# Patient Record
Sex: Female | Born: 1945 | Race: White | Hispanic: No | Marital: Single
Health system: Southern US, Community
[De-identification: ages and names within clinical notes are randomized; demographics above are authoritative.]

---

## 2004-12-28 ENCOUNTER — Ambulatory Visit: Payer: Self-pay | Admitting: Family Medicine

## 2006-06-08 ENCOUNTER — Other Ambulatory Visit: Payer: Self-pay

## 2006-06-08 ENCOUNTER — Emergency Department: Payer: Self-pay | Admitting: Emergency Medicine

## 2009-06-23 ENCOUNTER — Ambulatory Visit: Payer: Self-pay | Admitting: Vascular Surgery

## 2012-01-13 ENCOUNTER — Emergency Department: Payer: Self-pay | Admitting: Internal Medicine

## 2012-01-13 LAB — CBC
HCT: 38.6 % (ref 35.0–47.0)
HGB: 12.6 g/dL (ref 12.0–16.0)
MCH: 28.4 pg (ref 26.0–34.0)
MCHC: 32.7 g/dL (ref 32.0–36.0)
RBC: 4.44 10*6/uL (ref 3.80–5.20)
RDW: 13.4 % (ref 11.5–14.5)

## 2012-01-13 LAB — URINALYSIS, COMPLETE
Bilirubin,UR: NEGATIVE
Nitrite: NEGATIVE
Ph: 5 (ref 4.5–8.0)
Protein: 100
RBC,UR: 3 /HPF (ref 0–5)
Specific Gravity: 1.015 (ref 1.003–1.030)
WBC UR: 1 /HPF (ref 0–5)

## 2012-01-13 LAB — COMPREHENSIVE METABOLIC PANEL
Alkaline Phosphatase: 108 U/L (ref 50–136)
Bilirubin,Total: 0.5 mg/dL (ref 0.2–1.0)
Calcium, Total: 9.1 mg/dL (ref 8.5–10.1)
Co2: 23 mmol/L (ref 21–32)
Creatinine: 1.12 mg/dL (ref 0.60–1.30)
Potassium: 4.5 mmol/L (ref 3.5–5.1)
SGOT(AST): 22 U/L (ref 15–37)
SGPT (ALT): 16 U/L (ref 12–78)
Total Protein: 8 g/dL (ref 6.4–8.2)

## 2012-01-13 LAB — LIPASE, BLOOD: Lipase: 113 U/L (ref 73–393)

## 2012-04-25 ENCOUNTER — Emergency Department: Payer: Self-pay | Admitting: Emergency Medicine

## 2012-04-25 LAB — CBC
Platelet: 279 10*3/uL (ref 150–440)
RBC: 4.7 10*6/uL (ref 3.80–5.20)
RDW: 13.5 % (ref 11.5–14.5)
WBC: 11.9 10*3/uL — ABNORMAL HIGH (ref 3.6–11.0)

## 2012-04-25 LAB — COMPREHENSIVE METABOLIC PANEL
Alkaline Phosphatase: 113 U/L (ref 50–136)
Anion Gap: 12 (ref 7–16)
Calcium, Total: 9.5 mg/dL (ref 8.5–10.1)
Chloride: 101 mmol/L (ref 98–107)
Co2: 21 mmol/L (ref 21–32)
Creatinine: 1.18 mg/dL (ref 0.60–1.30)
EGFR (African American): 56 — ABNORMAL LOW
EGFR (Non-African Amer.): 48 — ABNORMAL LOW
Potassium: 4.5 mmol/L (ref 3.5–5.1)
SGOT(AST): 21 U/L (ref 15–37)
Sodium: 134 mmol/L — ABNORMAL LOW (ref 136–145)
Total Protein: 8.2 g/dL (ref 6.4–8.2)

## 2012-04-25 LAB — URINALYSIS, COMPLETE
Leukocyte Esterase: NEGATIVE
Nitrite: NEGATIVE
Ph: 6 (ref 4.5–8.0)
Protein: 100
Specific Gravity: 1.013 (ref 1.003–1.030)

## 2012-04-25 LAB — LIPASE, BLOOD: Lipase: 78 U/L (ref 73–393)

## 2012-04-27 ENCOUNTER — Inpatient Hospital Stay: Payer: Self-pay | Admitting: Internal Medicine

## 2012-04-27 LAB — COMPREHENSIVE METABOLIC PANEL
Albumin: 3.8 g/dL (ref 3.4–5.0)
Alkaline Phosphatase: 107 U/L (ref 50–136)
BUN: 39 mg/dL — ABNORMAL HIGH (ref 7–18)
Calcium, Total: 8.9 mg/dL (ref 8.5–10.1)
Glucose: 308 mg/dL — ABNORMAL HIGH (ref 65–99)
Osmolality: 284 (ref 275–301)
Potassium: 3.8 mmol/L (ref 3.5–5.1)
SGOT(AST): 27 U/L (ref 15–37)
SGPT (ALT): 25 U/L (ref 12–78)
Sodium: 131 mmol/L — ABNORMAL LOW (ref 136–145)

## 2012-04-27 LAB — URINALYSIS, COMPLETE
Glucose,UR: >=500 mg/dL (ref 0–75)
Nitrite: NEGATIVE
Ph: 5 (ref 4.5–8.0)
Protein: 100
RBC,UR: 5 /HPF (ref 0–5)

## 2012-04-27 LAB — CBC WITH DIFFERENTIAL/PLATELET
Basophil #: 0.2 10*3/uL — ABNORMAL HIGH (ref 0.0–0.1)
Eosinophil #: 0 10*3/uL (ref 0.0–0.7)
HCT: 42.1 % (ref 35.0–47.0)
Lymphocyte %: 8.6 %
MCHC: 33.6 g/dL (ref 32.0–36.0)
MCV: 87 fL (ref 80–100)
Monocyte %: 5 %
Neutrophil %: 85.6 %
RBC: 4.85 10*6/uL (ref 3.80–5.20)
RDW: 13.6 % (ref 11.5–14.5)
WBC: 24.1 10*3/uL — ABNORMAL HIGH (ref 3.6–11.0)

## 2012-04-27 LAB — LIPASE, BLOOD: Lipase: 80 U/L (ref 73–393)

## 2012-04-27 LAB — PREGNANCY, URINE: Pregnancy Test, Urine: NEGATIVE m[IU]/mL

## 2012-04-28 LAB — CBC WITH DIFFERENTIAL/PLATELET
Basophil #: 0.1 10*3/uL (ref 0.0–0.1)
Basophil %: 0.3 %
Eosinophil #: 0 10*3/uL (ref 0.0–0.7)
Lymphocyte %: 10.4 %
MCH: 30 pg (ref 26.0–34.0)
MCHC: 34.7 g/dL (ref 32.0–36.0)
Monocyte #: 1.4 x10 3/mm — ABNORMAL HIGH (ref 0.2–0.9)
Neutrophil #: 18.2 10*3/uL — ABNORMAL HIGH (ref 1.4–6.5)
Neutrophil %: 82.8 %
Platelet: 234 10*3/uL (ref 150–440)
RDW: 13.3 % (ref 11.5–14.5)

## 2012-04-28 LAB — BASIC METABOLIC PANEL
BUN: 33 mg/dL — ABNORMAL HIGH (ref 7–18)
Calcium, Total: 8.3 mg/dL — ABNORMAL LOW (ref 8.5–10.1)
Chloride: 99 mmol/L (ref 98–107)
Co2: 27 mmol/L (ref 21–32)
EGFR (African American): 49 — ABNORMAL LOW
EGFR (Non-African Amer.): 42 — ABNORMAL LOW
Osmolality: 285 (ref 275–301)
Potassium: 3.5 mmol/L (ref 3.5–5.1)

## 2012-04-29 LAB — TROPONIN I
Troponin-I: 0.06 ng/mL — ABNORMAL HIGH
Troponin-I: 0.06 ng/mL — ABNORMAL HIGH

## 2012-04-30 LAB — CBC WITH DIFFERENTIAL/PLATELET
Basophil #: 0.1 10*3/uL (ref 0.0–0.1)
Eosinophil #: 0.2 10*3/uL (ref 0.0–0.7)
Eosinophil %: 1.6 %
HCT: 36.9 % (ref 35.0–47.0)
Lymphocyte #: 3.2 10*3/uL (ref 1.0–3.6)
MCH: 29.6 pg (ref 26.0–34.0)
MCV: 86 fL (ref 80–100)
Monocyte #: 0.8 x10 3/mm (ref 0.2–0.9)
Neutrophil #: 9.3 10*3/uL — ABNORMAL HIGH (ref 1.4–6.5)
Neutrophil %: 67.8 %
Platelet: 202 10*3/uL (ref 150–440)
RDW: 13.4 % (ref 11.5–14.5)

## 2012-04-30 LAB — BASIC METABOLIC PANEL
BUN: 23 mg/dL — ABNORMAL HIGH (ref 7–18)
Chloride: 102 mmol/L (ref 98–107)
Creatinine: 1.34 mg/dL — ABNORMAL HIGH (ref 0.60–1.30)
EGFR (Non-African Amer.): 41 — ABNORMAL LOW
Osmolality: 283 (ref 275–301)

## 2012-05-29 DEATH — deceased

## 2013-09-03 IMAGING — CT CT ABD-PELV W/O CM
1 of 2 series · 15 of 32 positions shown, 19 images · non-contrast
Comparison: none

REASON FOR EXAM: (1) n/v/d with epigastric abd pain, h/o diverticulitis.;
(2) n/v/d with epigastr
COMMENTS:

PROCEDURE:     CT  - CT ABDOMEN AND PELVIS W[DATE]  [DATE]
RESULT:     CT abdomen and pelvis dated 04/25/2012.
TECHNIQUE: Helical noncontrasted 3 mm sections were obtained from the lung
bases through the pubic symphysis. Findings: The lung bases are unremarkable.

[Series 2: 3mm soft tissue · axial · 0.98mm/px · z∈[-518,-96]mm · 15 of 155 slices shown, 19 images]
[im 7/155  soft-tissue]
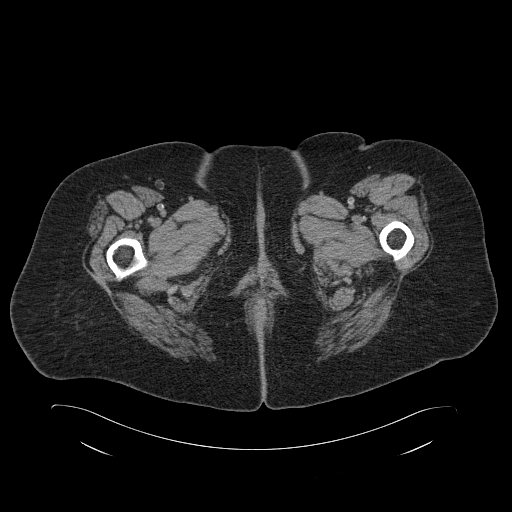
[im 7/155  bone]
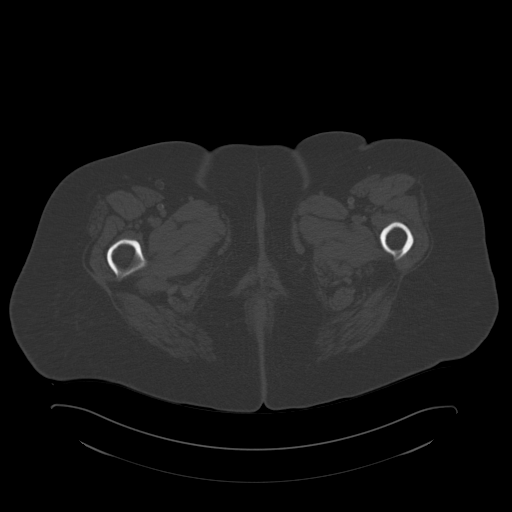
[im 20/155  soft-tissue]
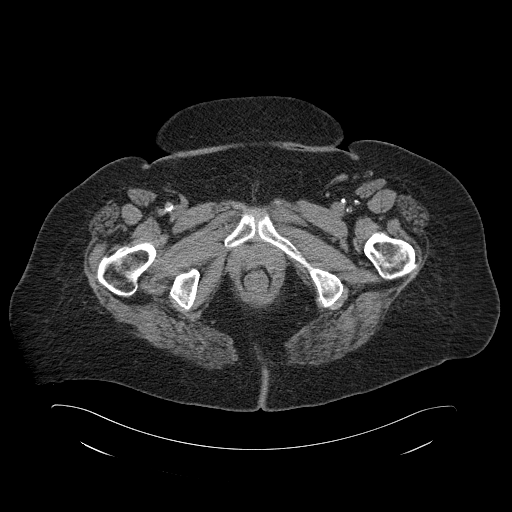
[im 33/155  soft-tissue]
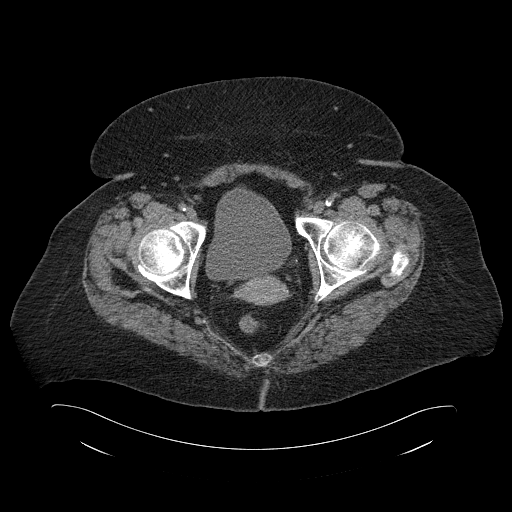
[im 45/155  soft-tissue]
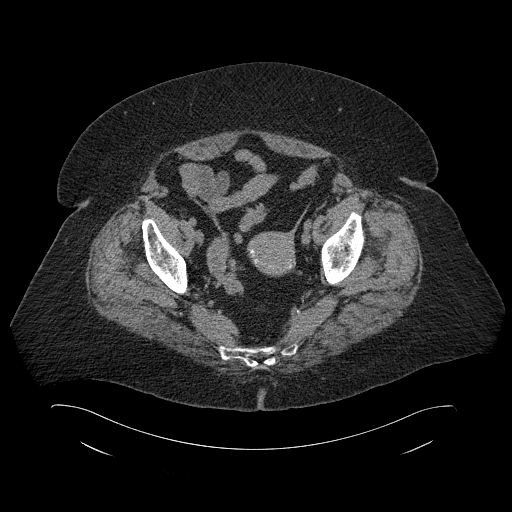
[im 52/155  soft-tissue]
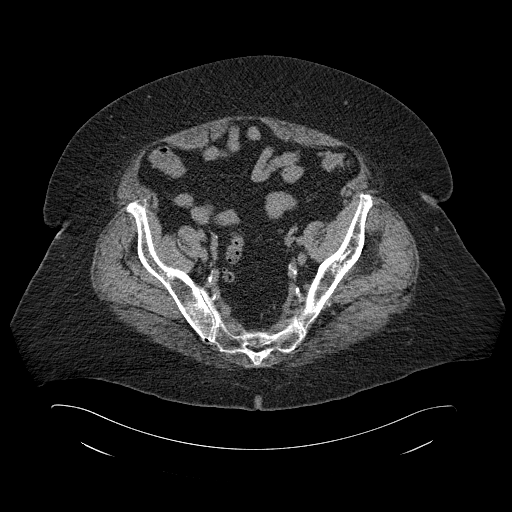
[im 65/155  soft-tissue]
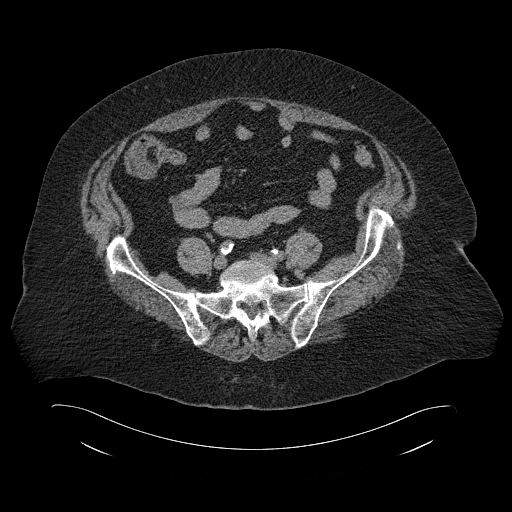
[im 78/155  soft-tissue]
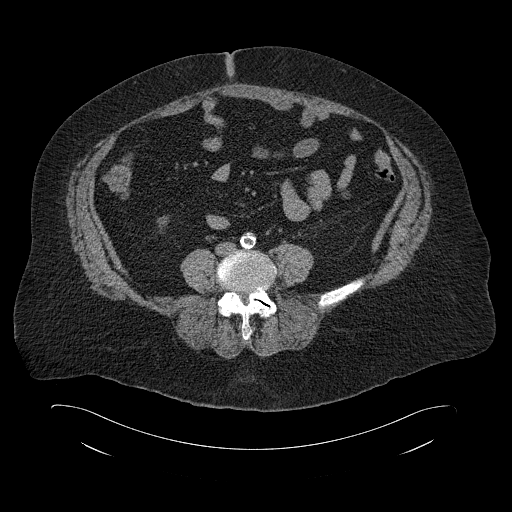
[im 90/155  soft-tissue]
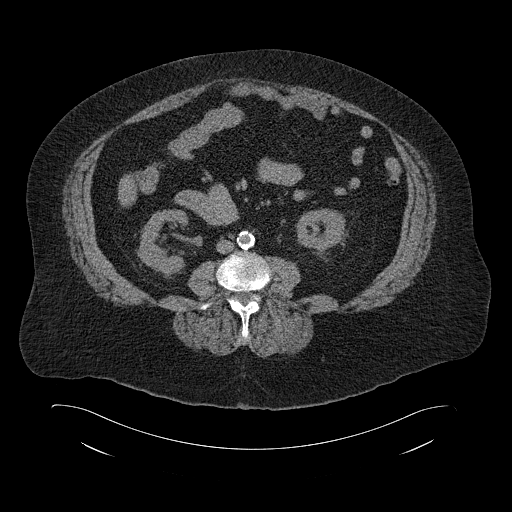
[im 103/155  soft-tissue]
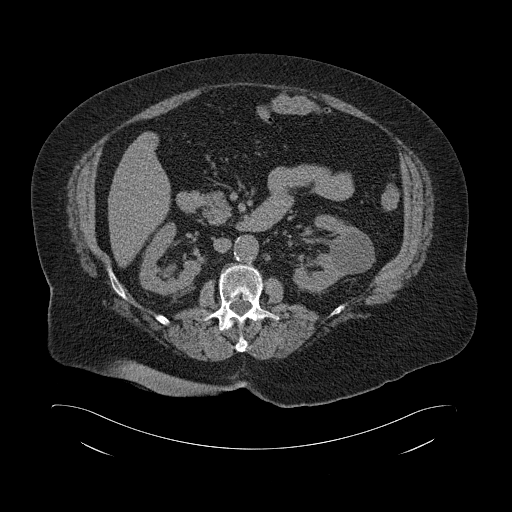
[im 103/155  bone]
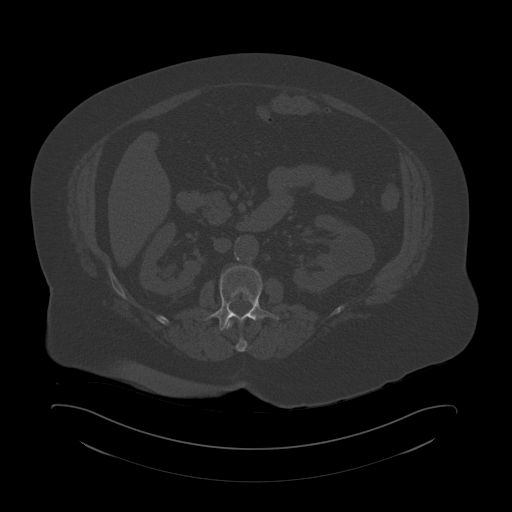
[im 110/155  soft-tissue]
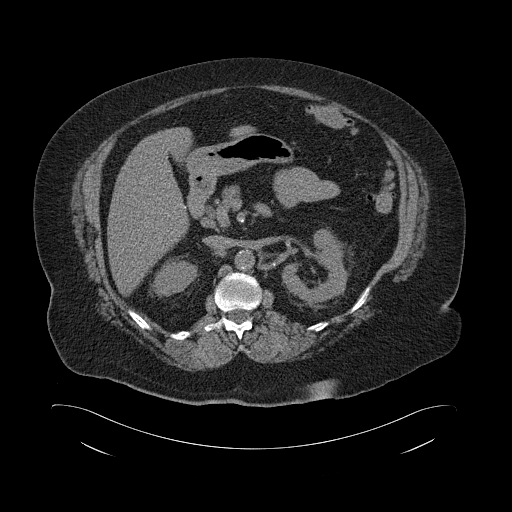
[im 122/155  soft-tissue]
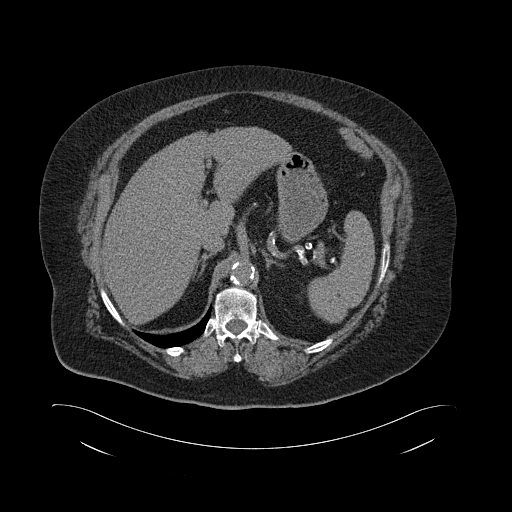
[im 129/155  lung]
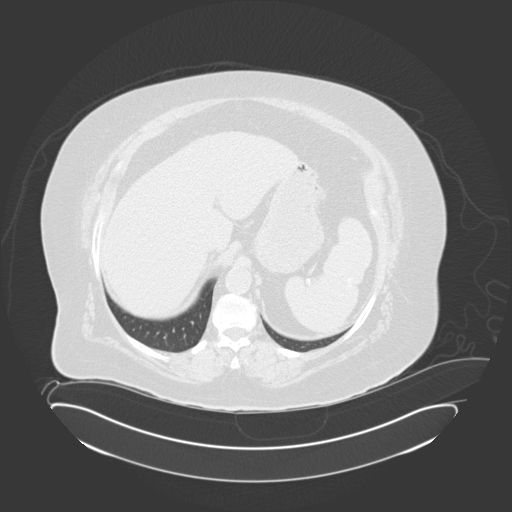
[im 135/155  soft-tissue]
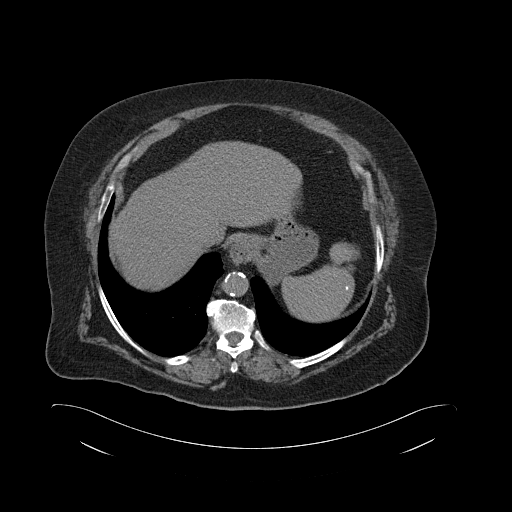
[im 135/155  lung]
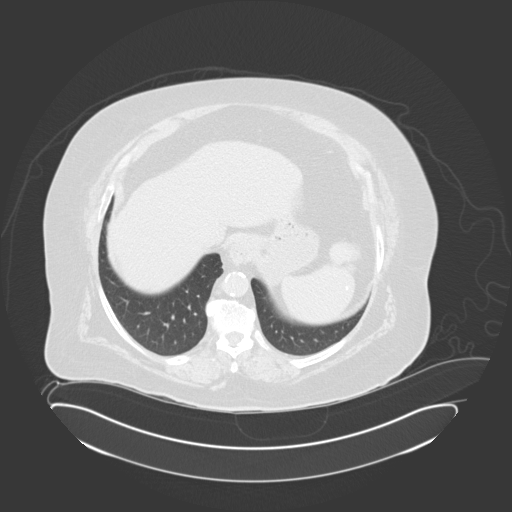
[im 142/155  lung]
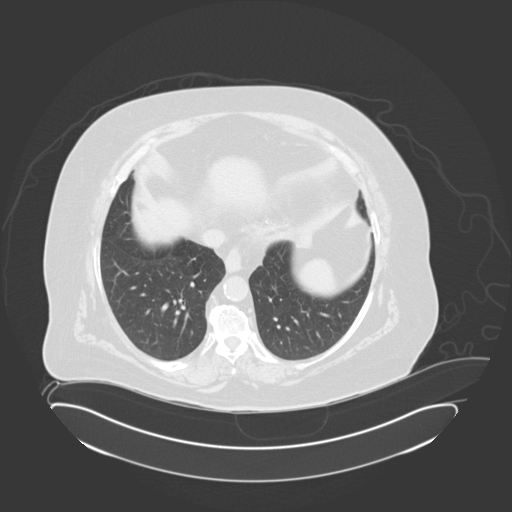
[im 148/155  soft-tissue]
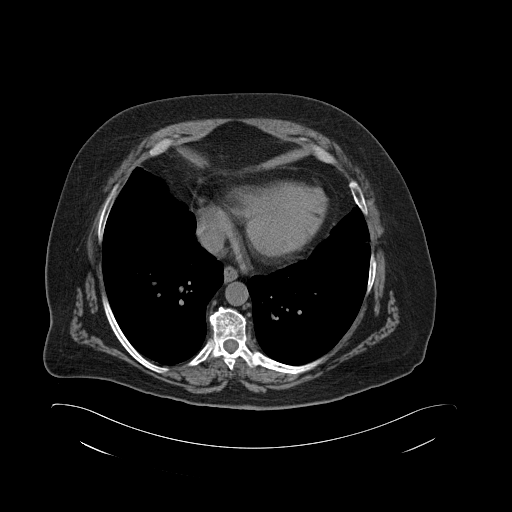
[im 148/155  lung]
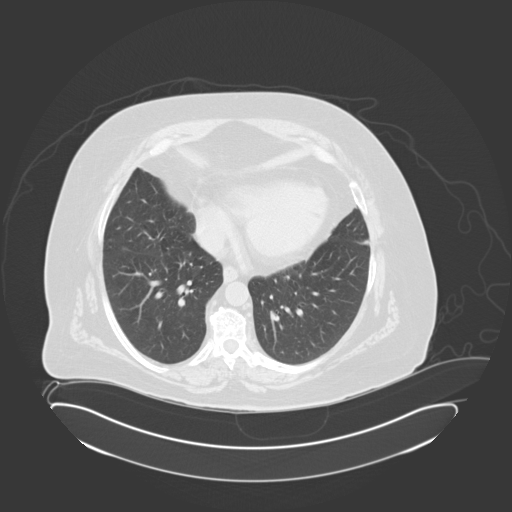

[15 of 32 positions shown; findings below may reference images not displayed]

Noncontrast evaluation of the liver, adrenals, pancreas its unremarkable.
Dystrophic appearing calcifications are appreciated within the spleen which
may represent prior granulomatous, infection, or trauma.

There is no evidence of abdominal or pelvic free fluid, loculated fluid
collections, masses nor adenopathy. There is no CT evidence of
nephrolithiasis, ureterolithiasis, hydroureter nor hydronephrosis. A stable
cyst is appreciated involving the left kidney. Within the pelvis a [DATE] to 3
cm soft tissue focus of dystrophic calcifications projects along the
anterior lateral fundal portion of the uterus likely represents a dystrophic
exophytic fibroid. Pelvis is otherwise unremarkable.

There is diverticulosis within the sigmoid colon without evidence of
diverticulitis. There is no evidence of abdominal aortic aneurysm.
IMPRESSION: No CT evidence of obstructive or inflammatory abnormalities.

## 2014-09-15 NOTE — Discharge Summary (Signed)
PATIENT NAME:  Carly Travis, Carly Travis MR#:  [id number] DATE OF BIRTH:  Dec 07, 1945  DATE OF ADMISSION:  04/27/2012 DATE OF DISCHARGE:  04/30/2012  DIAGNOSES:  1. Abdominal pain, nausea, vomiting, diarrhea possibly due to viral gastroenteritis, possible gastroesophageal reflux disease/gastroparesis. 2. Leukocytosis. 3. Acute renal failure. 4. Diabetes. 5. Hypertension. 6. Hyperlipidemia. 7. Depression. 8. Smoking. 9. Chest pain with inpatient stress test showing no evidence of any ischemia.   CONSULTATION: Cardiology consultation with Dr. Darrold JunkerParaschos   LABORATORY, DIAGNOSTIC, AND RADIOLOGICAL DATA: Inpatient Nuclear Medicine stress test showed no evidence of any reversible ischemia. The patient had EF of about 45% with anterior hypokinesis. The patient does have a history of prior MI.   CT of the abdomen which was done on November 20th was essentially normal.   Stool culture was negative. C. difficile was negative. White count 24.1 at the time of admission, 13.7 by the time of discharge. Hemoglobin and platelet count normal. Creatinine 1.50 to 1.34, potassium 3.8, was supplemented. Magnesium supplemented. Troponin 0.11 to 0.06. Glucose 170, BUN 23.   HOSPITAL COURSE: The patient is a 69 year old female with past medical history of diabetes, hypertension, smoking, PAD status post lower extremity stents who presented with nausea, vomiting, diarrhea, and leukocytosis. She had come to the ER a couple of days earlier complaining of similar symptoms. At that time a CT of the abdomen showed no acute abnormalities. She was discharged home. She had to be admitted to the hospital because her white count had gone up and she had ongoing nausea, vomiting, and diarrhea. She was admitted to the hospital and started on IV fluids, p.r.n. antiemetics. She was on a clear liquid diet and was subsequently advanced to a regular diet. After admission the patient had no diarrhea. She had two to three stools. C. difficile and stool  cultures were sent which were normal. She was empirically treated with antibiotics, however, once the stool cultures came back negative and she had no further diarrhea in the hospital her antibiotics were stopped. She had no vomiting but complained of some nausea. She also had retrosternal burning pain and there was a possibility that she may have underlying gastroesophageal reflux disease or gastroparesis related to her diabetes. She has been started on PPI. Her leukocytosis has significantly improved. She had acute renal failure due to nausea, vomiting, and diarrhea which is improved. Her diabetes has remained well controlled. Due to her acute renal failure, her diuretic therapy for hypertension was stopped and she was started on a beta-blocker. She has been extensively counseled about smoking cessation. During the hospitalization she also complained of some retrosternal chest pain. Cardiac enzymes were checked and were very minimally elevated. Cardiology consultation with Dr. Darrold JunkerParaschos was obtained who recommended doing an inpatient stress test. The stress test showed no evidence of any acute ischemia. The patient had a fixed anterior defect with EF of 45%. She does have a history of MI in the past. She had low potassium and magnesium which were supplemented. The patient was extremely anxious and had to be started on as needed Xanax. She is being discharged home in a stable condition. If she continues to have nausea, she may require an outpatient GI evaluation.   TIME SPENT: 45 minutes.   ____________________________ Darrick MeigsSangeeta Annette Liotta, MD sp:drc D: 05/01/2012 14:11:00 ET T: 05/01/2012 14:19:49 ET JOB#: [id number]  cc: Darrick MeigsSangeeta Josaiah Muhammed, MD, <Dictator> Darrick MeigsSANGEETA Kaylyne Axton MD ELECTRONICALLY SIGNED 05/01/2012 14:53

## 2014-09-15 NOTE — Consult Note (Signed)
Brief Consult Note: Diagnosis: CP, borferline elevated trop in setting of viral gastoenteritis, dehydration, advanced to clear liquids.   Patient was seen by consultant.   Consult note dictated.   Comments: REC  Agree with current therapy, defer full dose anticoagulation, echo, Lexi-sest in am ( patient had clear liquids this am ).  Electronic Signatures: Marcina MillardParaschos, Gene Colee (MD)  (Signed 02-Dec-13 08:56)  Authored: Brief Consult Note   Last Updated: 02-Dec-13 08:56 by Marcina MillardParaschos, Keelin Sheridan (MD)

## 2014-09-15 NOTE — H&P (Signed)
PATIENT NAME:  Carly Travis, Carly Travis MR#:  [id number] DATE OF BIRTH:  [birth year]  DATE OF ADMISSION:  04/27/2012  PRIMARY CARE PHYSICIAN: [facility]    CHIEF COMPLAINT: Nausea, vomiting, and diarrhea.   HISTORY OF PRESENT ILLNESS: This is a 69 year old female who presented to the Emergency Room due to nausea, vomiting, diarrhea ongoing now for the past 3 to 4 days. The patient said symptoms developed on Tuesday. She came to the ER two days ago for similar concerns, underwent a CT scan of the abdomen and pelvis which was benign. She had a mildly elevated white count at 11,000 then. She was discharged home, thought to have a viral gastroenteritis. She now returns again. Symptoms have not improved as she is having worsening nausea, vomiting, and diarrhea and her white count has gone up to 24,000. She denies any documented fever. Does admit to some chills. She also complains of abdominal pain which is diffuse in nature. She denies any chest pain, shortness of breath, cough, or any other associated symptoms presently. Given the fact that she has not improved in the past two days and she has a worsening white cell count, hospitalist services were contacted for further treatment and evaluation.   REVIEW OF SYSTEMS: CONSTITUTIONAL: No documented fever. No weight gain. No weight loss. EYES: No blurry or double vision. ENT: No tinnitus. No postnasal drip. No redness of the oropharynx. RESPIRATORY: No cough, no wheeze, no hemoptysis, no dyspnea. CARDIOVASCULAR: No chest pain, no orthopnea, no palpitations, no syncope. GI: Positive nausea. Positive vomiting. Positive diarrhea. Generalized abdominal pain. No melena. No hematochezia. GU: No dysuria. No hematuria. ENDOCRINE: No polyuria or nocturia. No heat or treated. HEME: No anemia. No bruising. No bleeding. INTEGUMENTARY: No rashes. No lesions. MUSCULOSKELETAL: No arthritis, no swelling, no gout. NEUROLOGIC: No numbness, no tingling, no ataxia, no seizure-type  activity. PSYCH: Positive depression. No anxiety, no insomnia, no ADD.   PAST MEDICAL HISTORY:  1. Diabetes. 2. Hypertension. 3. Hyperlipidemia. 4. Depression. 5. Ongoing tobacco abuse.   ALLERGIES: Penicillin causes hives.   SOCIAL HISTORY: She does smoke a pack of cigarettes daily. Has been smoking for the past 40+ years. No alcohol abuse. Occasionally smokes marijuana. No other illicit drug abuse. Lives at home with her children.   FAMILY HISTORY: Both mother and father died from malignancy. Mother had kidney cancer. Father cancer of unknown type but it was significantly metastatic.   CURRENT MEDICATIONS: She cannot recall her current medications. She apparently is on Novolin 70/30 but does not recall how much. She is also on a new antihypertensive med but she cannot recall its name. She is also on lovastatin supposedly 40 mg daily. She is on Celexa 20 mg daily. She cannot recall any of her other meds. She gets her medications through [facility]. The pharmacy is currently closed. We will try to obtain an accurate med list hopefully in the next day or so.   PHYSICAL EXAMINATION:   VITAL SIGNS: Temperature 98, pulse 106, respirations 20, blood pressure 179/100, sats 99% on room air.   GENERAL: The patient is a pleasant appearing female but in no apparent distress.   HEENT: Atraumatic, normocephalic. Extraocular muscles are intact. Pupils equal, reactive to light. Sclerae anicteric. No conjunctival injection. No pharyngeal erythema.   NECK: Supple. No jugular venous distention, no bruits, no lymphadenopathy, no thyromegaly.   HEART: Regular rate and rhythm, tachycardic. No murmurs, no rubs, no clicks.   LUNGS: Clear to auscultation bilaterally. No rales, no rhonchi,  no wheezes.   ABDOMEN: Soft, flat, nondistended. She is tender diffusely but no rebound, no rigidity. Hypoactive bowel sounds. No hepatosplenomegaly appreciated.   EXTREMITIES: No evidence of any cyanosis,  clubbing, or peripheral edema. Has +2 pedal and radial pulses bilaterally.   NEUROLOGIC: The patient is alert, awake, and oriented x3 with no focal motor or sensory deficits appreciated bilaterally.   SKIN: Moist and warm with no rash appreciated.   LYMPHATIC: There is no cervical or axillary lymphadenopathy.   LABORATORY, DIAGNOSTIC, AND RADIOLOGICAL DATA: Serum glucose 308, BUN 39, creatinine 1.5, sodium 131, potassium 3.8, chloride 93, bicarb 28. LFTs are within normal limits. White cell count 24.1, hemoglobin 14.1, hematocrit 42.1, platelet count 288. Urinalysis is within normal limits.   ASSESSMENT AND PLAN: This is a 69 year old female with a history of hypertension, hyperlipidemia, depression, diabetes, and tobacco abuse who presents to the hospital with abdominal pain, nausea, vomiting, and diarrhea. The patient's symptoms have gotten worse in the past two days. Her white cell count is now up to 24,000. She had a CT scan of the abdomen and pelvis done two days ago which showed no evidence of any acute intraabdominal pathology but did show diverticulosis.  1. Abdominal pain, nausea, vomiting, and diarrhea. The exact etiology is currently unclear, whether it is viral gastroenteritis versus possible acute diverticulitis. The patient had a CT abdomen and pelvis two days ago which was fairly normal, therefore, I will not repeat any imaging study at this point. It did show some diverticulosis. Therefore, I will empirically start treating her for possible diverticulitis. Give her supportive care with IV fluids, antiemetics, and pain control. Empirically start treating with IV ciprofloxacin and Flagyl. Check stool for C. difficile comprehensive culture, ova and parasites, and follow her clinically.  2. Acute renal failure. This is likely secondary to diarrhea, nausea, and vomiting. I will give her IV fluids. Follow BUN, creatinine, and urine output. Hold her diuretics for now.  3. Leukocytosis. This is  likely secondary to the suspected viral infection/diverticulitis. I will follow her white cell count after treatment with IV antibiotics.  4. Diabetes. The patient cannot recall what she takes for her diabetes. For now since she's going to be on a clear liquid diet I will just place her on sliding scale insulin coverage for now. Check a hemoglobin A1c.  5. Hyperlipidemia. Will continue lovastatin.  6. Depression. Continue Celexa. 7. Hypertension. The patient cannot recall her antihypertensive meds at this point. Therefore, I will not start any of her meds at this point. Since she is having nausea, vomiting, and diarrhea and she is currently normotensive hold off on any meds at this point.   CODE STATUS: The patient is a FULL CODE.   TIME SPENT WITH THE ADMISSION: 50 minutes.   ____________________________ Rolly PancakeVivek J. Cherlynn KaiserSainani, MD vjs:drc D: 04/27/2012 16:10:05 ET T: 04/27/2012 16:21:19 ET JOB#: [id number]  cc: Rolly PancakeVivek J. Cherlynn KaiserSainani, MD, <Dictator> [facility] SirenVIVEK J Kimberleigh Mehan MD ELECTRONICALLY SIGNED 05/02/2012 20:31

## 2014-09-15 NOTE — Consult Note (Signed)
PATIENT NAME:  Carly Travis, Carly Travis MR#:  [id number] DATE OF BIRTH:  1945-09-24  DATE OF CONSULTATION:  04/29/2012  REFERRING PHYSICIAN:  Rolly PancakeVivek Travis. Cherlynn KaiserSainani, MD  CONSULTING PHYSICIAN:  Marcina MillardAlexander Tayler Heiden, MD  CHIEF COMPLAINT: Nausea.   REASON FOR CONSULTATION: Consultation requested for evaluation of chest pain and borderline elevated troponin.   HISTORY OF PRESENT ILLNESS: The patient is a 69 year old female who was admitted on 03/31/2012 with probable viral gastroenteritis with nausea, vomiting and diarrhea. Since her hospitalization her symptoms have mildly improved, currently on clear liquids. Last evening the patient did have some chest discomfort. Since her hospitalization, she has had a borderline elevated troponin fluctuating from 0.06 to 0.11 without EKG changes. The patient reports she has had a remote history of myocardial infarction and stents in her left lower extremity   PAST MEDICAL HISTORY:  1. Remote history of myocardial infarction. 2. Peripheral vascular disease with stents in the left lower extremity.  3. Hypertension.  4. Hyperlipidemia.  5. Depression.  6. Diabetes.  7. Chronic obstructive pulmonary disease.   MEDICATIONS:  1. Lovastatin 40 mg daily.  2. Celexa 20 mg daily.   SOCIAL HISTORY: The patient currently lives alone. She smokes a pack of cigarettes a day.   FAMILY HISTORY: No immediate family history for coronary disease or myocardial infarction.   REVIEW OF SYSTEMS: CONSTITUTIONAL: No fever or chills. EYES: No blurry vision. EARS: No hearing loss. RESPIRATORY: Some mild shortness of breath. CARDIOVASCULAR: Episode of chest pain last evening. GASTROINTESTINAL: Nausea, vomiting and diarrhea as described above. GU: No dysuria or hematuria. ENDOCRINE: No polyuria or polydipsia. MUSCULOSKELETAL: No arthralgias or myalgias. NEUROLOGICAL: No focal muscle weakness or numbness. PSYCHOLOGICAL: The patient does have a history of depression.   PHYSICAL EXAMINATION:  VITAL  SIGNS: Blood pressure 135/78, pulse 99, respirations 20, temperature 97.9, pulse oximetry 96%.   HEENT: Pupils are equal, reactive to light and accommodation.   NECK: Supple without thyromegaly.   LUNGS: Clear.   CARDIOVASCULAR: Normal jugular venous pressure. Normal point of maximal impulse. Regular rate and rhythm. Normal S1, S2. No appreciable gallop, murmur, or rub.   ABDOMEN: Soft and nontender. Pulses were intact bilaterally.   MUSCULOSKELETAL: Normal muscle tone.   NEUROLOGICAL: The patient is alert and oriented x3. Motor and sensory are both grossly intact.   IMPRESSION: A 69 year old female with multiple cardiovascular risk factors who presents with nausea, vomiting, and diarrhea consistent with severe viral gastroenteritis. Currently diet is advanced to clear liquids. The patient has had borderline elevated troponin without peak or trough. She did have an episode of chest discomfort last evening without EKG changes. Unclear whether this is actually an acute coronary syndrome coincidental with a viral gastroenteritis. The patient currently is chest pain free.   RECOMMENDATIONS:  1. Defer full-dose anticoagulation at this time.  2. Agree with long-acting nitrates and beta blockers. 3. A 2-D echocardiogram to evaluate left ventricular function.  4. Continue to advance diet.  5. Lexiscan sestamibi study in the a.m. to assess for myocardial ischemia.    ____________________________ Marcina MillardAlexander Brytani Voth, MD ap:cbb D: 04/29/2012 08:47:42 ET T: 04/29/2012 10:07:19 ET JOB#: [id number]  cc: Marcina MillardAlexander Alyanna Stoermer, MD, <Dictator> Marcina MillardALEXANDER Komal Stangelo MD ELECTRONICALLY SIGNED 05/10/2012 14:28
